# Patient Record
Sex: Female | Born: 1955 | Race: Black or African American | Hispanic: No | Marital: Married | Smoking: Former smoker
Health system: Southern US, Community
[De-identification: ages and names within clinical notes are randomized; demographics above are authoritative.]

## PROBLEM LIST (undated history)

## (undated) DIAGNOSIS — C801 Malignant (primary) neoplasm, unspecified: Secondary | ICD-10-CM

## (undated) DIAGNOSIS — I10 Essential (primary) hypertension: Secondary | ICD-10-CM

## (undated) DIAGNOSIS — C50919 Malignant neoplasm of unspecified site of unspecified female breast: Secondary | ICD-10-CM

---

## 2000-08-28 DIAGNOSIS — C801 Malignant (primary) neoplasm, unspecified: Secondary | ICD-10-CM

## 2000-08-28 DIAGNOSIS — C50919 Malignant neoplasm of unspecified site of unspecified female breast: Secondary | ICD-10-CM

## 2000-08-28 HISTORY — DX: Malignant (primary) neoplasm, unspecified: C80.1

## 2000-08-28 HISTORY — PX: BREAST BIOPSY: SHX20

## 2000-08-28 HISTORY — DX: Malignant neoplasm of unspecified site of unspecified female breast: C50.919

## 2004-10-06 ENCOUNTER — Ambulatory Visit: Payer: Self-pay | Admitting: Oncology

## 2004-10-26 ENCOUNTER — Ambulatory Visit: Payer: Self-pay | Admitting: Oncology

## 2005-04-03 ENCOUNTER — Ambulatory Visit: Payer: Self-pay | Admitting: General Surgery

## 2005-04-06 ENCOUNTER — Ambulatory Visit: Payer: Self-pay | Admitting: Oncology

## 2005-04-28 ENCOUNTER — Ambulatory Visit: Payer: Self-pay | Admitting: Oncology

## 2005-10-17 ENCOUNTER — Ambulatory Visit: Payer: Self-pay | Admitting: Oncology

## 2005-10-26 ENCOUNTER — Ambulatory Visit: Payer: Self-pay | Admitting: Oncology

## 2006-04-02 ENCOUNTER — Ambulatory Visit: Payer: Self-pay | Admitting: Oncology

## 2006-04-04 ENCOUNTER — Ambulatory Visit: Payer: Self-pay | Admitting: General Surgery

## 2006-04-28 ENCOUNTER — Ambulatory Visit: Payer: Self-pay | Admitting: Oncology

## 2006-08-30 ENCOUNTER — Ambulatory Visit: Payer: Self-pay | Admitting: Oncology

## 2006-09-28 ENCOUNTER — Ambulatory Visit: Payer: Self-pay | Admitting: Oncology

## 2007-01-27 ENCOUNTER — Ambulatory Visit: Payer: Self-pay | Admitting: Oncology

## 2007-02-25 ENCOUNTER — Ambulatory Visit: Payer: Self-pay | Admitting: Oncology

## 2007-02-26 ENCOUNTER — Ambulatory Visit: Payer: Self-pay | Admitting: Oncology

## 2007-04-17 ENCOUNTER — Ambulatory Visit: Payer: Self-pay | Admitting: General Surgery

## 2007-07-29 ENCOUNTER — Ambulatory Visit: Payer: Self-pay | Admitting: Oncology

## 2007-08-26 ENCOUNTER — Ambulatory Visit: Payer: Self-pay | Admitting: Oncology

## 2007-08-29 ENCOUNTER — Ambulatory Visit: Payer: Self-pay | Admitting: Oncology

## 2008-01-27 ENCOUNTER — Ambulatory Visit: Payer: Self-pay | Admitting: Oncology

## 2008-03-02 ENCOUNTER — Ambulatory Visit: Payer: Self-pay | Admitting: Oncology

## 2008-03-28 ENCOUNTER — Ambulatory Visit: Payer: Self-pay | Admitting: Oncology

## 2008-04-28 ENCOUNTER — Ambulatory Visit: Payer: Self-pay | Admitting: Oncology

## 2008-07-10 ENCOUNTER — Ambulatory Visit: Payer: Self-pay | Admitting: Gastroenterology

## 2008-09-28 ENCOUNTER — Ambulatory Visit: Payer: Self-pay | Admitting: Oncology

## 2008-10-07 ENCOUNTER — Ambulatory Visit: Payer: Self-pay | Admitting: Oncology

## 2008-10-26 ENCOUNTER — Ambulatory Visit: Payer: Self-pay | Admitting: Oncology

## 2009-03-28 ENCOUNTER — Ambulatory Visit: Payer: Self-pay | Admitting: Oncology

## 2009-04-22 ENCOUNTER — Ambulatory Visit: Payer: Self-pay | Admitting: Oncology

## 2009-04-26 ENCOUNTER — Ambulatory Visit: Payer: Self-pay | Admitting: Oncology

## 2009-04-28 ENCOUNTER — Ambulatory Visit: Payer: Self-pay | Admitting: Oncology

## 2009-09-28 ENCOUNTER — Ambulatory Visit: Payer: Self-pay | Admitting: Oncology

## 2009-10-07 ENCOUNTER — Ambulatory Visit: Payer: Self-pay | Admitting: Oncology

## 2009-10-26 ENCOUNTER — Ambulatory Visit: Payer: Self-pay | Admitting: Oncology

## 2010-04-25 ENCOUNTER — Ambulatory Visit: Payer: Self-pay | Admitting: Oncology

## 2010-04-28 ENCOUNTER — Ambulatory Visit: Payer: Self-pay | Admitting: Oncology

## 2010-04-29 LAB — CANCER ANTIGEN 27.29: CA 27.29: 14.5 U/mL (ref 0.0–38.6)

## 2010-05-28 ENCOUNTER — Ambulatory Visit: Payer: Self-pay | Admitting: Oncology

## 2011-03-27 ENCOUNTER — Ambulatory Visit: Payer: Self-pay | Admitting: Oncology

## 2011-03-29 ENCOUNTER — Ambulatory Visit: Payer: Self-pay | Admitting: Oncology

## 2011-06-15 ENCOUNTER — Ambulatory Visit: Payer: Self-pay | Admitting: Oncology

## 2011-12-06 ENCOUNTER — Ambulatory Visit: Payer: Self-pay | Admitting: Oncology

## 2011-12-06 LAB — COMPREHENSIVE METABOLIC PANEL
Anion Gap: 10 (ref 7–16)
BUN: 10 mg/dL (ref 7–18)
Calcium, Total: 9.3 mg/dL (ref 8.5–10.1)
Chloride: 103 mmol/L (ref 98–107)
Co2: 27 mmol/L (ref 21–32)
Glucose: 76 mg/dL (ref 65–99)
Potassium: 3.5 mmol/L (ref 3.5–5.1)
SGPT (ALT): 26 U/L
Total Protein: 7.7 g/dL (ref 6.4–8.2)

## 2011-12-06 LAB — CBC CANCER CENTER
Basophil #: 0 x10 3/mm (ref 0.0–0.1)
Eosinophil #: 0.1 x10 3/mm (ref 0.0–0.7)
Eosinophil %: 1.5 %
HCT: 37.4 % (ref 35.0–47.0)
HGB: 12.1 g/dL (ref 12.0–16.0)
Lymphocyte #: 2.6 x10 3/mm (ref 1.0–3.6)
Lymphocyte %: 35.5 %
MCH: 27.6 pg (ref 26.0–34.0)
MCV: 85 fL (ref 80–100)
Monocyte %: 9.6 %
Neutrophil #: 3.9 x10 3/mm (ref 1.4–6.5)
RBC: 4.37 10*6/uL (ref 3.80–5.20)
RDW: 13.3 % (ref 11.5–14.5)

## 2011-12-27 ENCOUNTER — Ambulatory Visit: Payer: Self-pay | Admitting: Oncology

## 2012-06-17 ENCOUNTER — Ambulatory Visit: Payer: Self-pay | Admitting: Oncology

## 2012-06-19 ENCOUNTER — Ambulatory Visit: Payer: Self-pay | Admitting: Oncology

## 2012-06-19 LAB — COMPREHENSIVE METABOLIC PANEL
Alkaline Phosphatase: 98 U/L (ref 50–136)
Anion Gap: 10 (ref 7–16)
Bilirubin,Total: 0.2 mg/dL (ref 0.2–1.0)
Chloride: 103 mmol/L (ref 98–107)
Co2: 27 mmol/L (ref 21–32)
Creatinine: 0.84 mg/dL (ref 0.60–1.30)
EGFR (African American): >60
EGFR (Non-African Amer.): >60
Glucose: 81 mg/dL (ref 65–99)
Osmolality: 278 (ref 275–301)
SGOT(AST): 25 U/L (ref 15–37)
SGPT (ALT): 28 U/L (ref 12–78)
Sodium: 140 mmol/L (ref 136–145)
Total Protein: 7.9 g/dL (ref 6.4–8.2)

## 2012-06-19 LAB — CBC CANCER CENTER
Basophil #: 0 x10 3/mm (ref 0.0–0.1)
Basophil %: 0.6 %
Eosinophil #: 0.2 x10 3/mm (ref 0.0–0.7)
Eosinophil %: 2.5 %
HCT: 38 % (ref 35.0–47.0)
HGB: 12 g/dL (ref 12.0–16.0)
Lymphocyte #: 2.8 x10 3/mm (ref 1.0–3.6)
MCH: 26.8 pg (ref 26.0–34.0)
MCHC: 31.6 g/dL — ABNORMAL LOW (ref 32.0–36.0)
MCV: 85 fL (ref 80–100)
Monocyte #: 0.8 x10 3/mm (ref 0.2–0.9)
Neutrophil #: 3 x10 3/mm (ref 1.4–6.5)
Neutrophil %: 44.5 %
Platelet: 301 x10 3/mm (ref 150–440)
RBC: 4.48 10*6/uL (ref 3.80–5.20)
RDW: 13.7 % (ref 11.5–14.5)

## 2012-06-28 ENCOUNTER — Ambulatory Visit: Payer: Self-pay | Admitting: Oncology

## 2013-07-09 ENCOUNTER — Ambulatory Visit: Payer: Self-pay | Admitting: Oncology

## 2013-07-14 ENCOUNTER — Ambulatory Visit: Payer: Self-pay | Admitting: Oncology

## 2013-07-14 LAB — COMPREHENSIVE METABOLIC PANEL
Albumin: 3.9 g/dL (ref 3.4–5.0)
Alkaline Phosphatase: 83 U/L (ref 50–136)
Bilirubin,Total: 0.2 mg/dL (ref 0.2–1.0)
Chloride: 102 mmol/L (ref 98–107)
Co2: 28 mmol/L (ref 21–32)
Creatinine: 1 mg/dL (ref 0.60–1.30)
EGFR (African American): >60
EGFR (Non-African Amer.): >60
Potassium: 3.6 mmol/L (ref 3.5–5.1)
SGOT(AST): 22 U/L (ref 15–37)
Sodium: 140 mmol/L (ref 136–145)
Total Protein: 8 g/dL (ref 6.4–8.2)

## 2013-07-14 LAB — CBC CANCER CENTER
Basophil %: 0.5 %
Eosinophil #: 0.2 x10 3/mm (ref 0.0–0.7)
Eosinophil %: 1.5 %
HCT: 39.8 % (ref 35.0–47.0)
HGB: 12.7 g/dL (ref 12.0–16.0)
MCHC: 31.8 g/dL — ABNORMAL LOW (ref 32.0–36.0)
Monocyte #: 0.7 x10 3/mm (ref 0.2–0.9)
Monocyte %: 7.2 %
Neutrophil #: 6 x10 3/mm (ref 1.4–6.5)
Neutrophil %: 58.7 %
Platelet: 348 x10 3/mm (ref 150–440)
RDW: 13.7 % (ref 11.5–14.5)
WBC: 10.2 x10 3/mm (ref 3.6–11.0)

## 2013-07-14 LAB — URINALYSIS, COMPLETE
Bacteria: NONE SEEN
Blood: NEGATIVE
Glucose,UR: NEGATIVE mg/dL (ref 0–75)
Ketone: NEGATIVE
Nitrite: NEGATIVE
Protein: NEGATIVE
Specific Gravity: 1.02 (ref 1.003–1.030)
Squamous Epithelial: 3
WBC UR: 3 /HPF (ref 0–5)

## 2013-07-28 ENCOUNTER — Ambulatory Visit: Payer: Self-pay | Admitting: Oncology

## 2014-09-22 ENCOUNTER — Ambulatory Visit: Payer: Self-pay | Admitting: Oncology

## 2014-10-13 ENCOUNTER — Ambulatory Visit: Payer: Self-pay | Admitting: Oncology

## 2016-03-28 ENCOUNTER — Other Ambulatory Visit: Payer: Self-pay | Admitting: Family Medicine

## 2016-03-28 DIAGNOSIS — Z1231 Encounter for screening mammogram for malignant neoplasm of breast: Secondary | ICD-10-CM

## 2016-04-14 ENCOUNTER — Ambulatory Visit: Payer: Self-pay

## 2016-04-24 ENCOUNTER — Ambulatory Visit
Admission: RE | Admit: 2016-04-24 | Discharge: 2016-04-24 | Disposition: A | Discharge status: Home / Self Care | Payer: BLUE CROSS/BLUE SHIELD | Source: Ambulatory Visit | Attending: Family Medicine | Admitting: Family Medicine

## 2016-04-24 ENCOUNTER — Other Ambulatory Visit: Payer: Self-pay | Admitting: Family Medicine

## 2016-04-24 ENCOUNTER — Ambulatory Visit: Payer: Self-pay

## 2016-04-24 DIAGNOSIS — Z1231 Encounter for screening mammogram for malignant neoplasm of breast: Secondary | ICD-10-CM

## 2016-04-24 HISTORY — DX: Malignant neoplasm of unspecified site of unspecified female breast: C50.919

## 2016-04-24 HISTORY — DX: Malignant (primary) neoplasm, unspecified: C80.1

## 2017-06-08 ENCOUNTER — Other Ambulatory Visit: Payer: Self-pay | Admitting: Family Medicine

## 2017-07-18 ENCOUNTER — Other Ambulatory Visit: Payer: Self-pay

## 2017-07-18 ENCOUNTER — Ambulatory Visit
Admission: RE | Admit: 2017-07-18 | Discharge: 2017-07-18 | Disposition: A | Discharge status: Home / Self Care | Payer: Self-pay | Source: Ambulatory Visit | Attending: Oncology | Admitting: Oncology

## 2017-07-18 ENCOUNTER — Ambulatory Visit: Payer: Self-pay | Attending: Oncology | Admitting: *Deleted

## 2017-07-18 ENCOUNTER — Encounter: Payer: Self-pay | Admitting: *Deleted

## 2017-07-18 VITALS — BP 154/89 | HR 81 | Temp 99.0°F | Ht 65.0 in | Wt 221.0 lb

## 2017-07-18 DIAGNOSIS — Z0000 Encounter for general adult medical examination without abnormal findings: Secondary | ICD-10-CM

## 2017-07-18 NOTE — Patient Instructions (Signed)
Gave patient hand-out, Women Staying Healthy, Active and Well from [facility], with education on breast health, pap smears, heart and colon health. 

## 2017-07-18 NOTE — Progress Notes (Signed)
Subjective:     Patient ID: Rhonda Nguyen, female   DOB: Sep 09, 1955, 61 y.o.   MRN: [id number]  HPI   Review of Systems     Objective:   Physical Exam  Pulmonary/Chest: Right breast exhibits no inverted nipple, no mass, no nipple discharge, no skin change and no tenderness. Left breast exhibits no inverted nipple, no mass, no nipple discharge, no skin change and no tenderness. Breasts are asymmetrical.         Assessment:     61 year old Black female presents to [facility] for clinical breast exam and mammogram only.  She has a history of left breast cancer diagnosed in 2003.  She was treated with adriamycin, cytoxan, radiation therapy and antihormonal therapy for about 8 years.  On clinical breast exam the left breast is very misshapen.  There is induration noted at the scar site.  The breast is also less than 1/2 the size of the right.  There is no dominate mass, skin changes, nipple discharge, or lymphadenopathy.  Taught self breast awareness.  States she has a family history of breast cancer in 2 sisters.  One diagnosed in her 27's and the other one in her 1's.  She is one of 11 children.  Last pap on 06/13/17 was HPV negative / ASCUS.  Explained results and need for another pap smear next year.  Patient has been screened for eligibility.  She does not have any insurance, Medicare or Medicaid.  She also meets financial eligibility.  Hand-out given on the Affordable Care Act.    Plan:     Screening mammogram ordered.  Will follow-up per [facility] protocol.

## 2017-08-17 ENCOUNTER — Emergency Department: Payer: Self-pay

## 2017-08-17 ENCOUNTER — Emergency Department
Admission: EM | Admit: 2017-08-17 | Discharge: 2017-08-17 | Disposition: A | Discharge status: Home / Self Care | Payer: Self-pay | Attending: Emergency Medicine | Admitting: Emergency Medicine

## 2017-08-17 ENCOUNTER — Other Ambulatory Visit: Payer: Self-pay

## 2017-08-17 DIAGNOSIS — Y92410 Unspecified street and highway as the place of occurrence of the external cause: Secondary | ICD-10-CM | POA: Insufficient documentation

## 2017-08-17 DIAGNOSIS — S39012A Strain of muscle, fascia and tendon of lower back, initial encounter: Secondary | ICD-10-CM | POA: Insufficient documentation

## 2017-08-17 DIAGNOSIS — S161XXA Strain of muscle, fascia and tendon at neck level, initial encounter: Secondary | ICD-10-CM | POA: Insufficient documentation

## 2017-08-17 DIAGNOSIS — Z79899 Other long term (current) drug therapy: Secondary | ICD-10-CM | POA: Insufficient documentation

## 2017-08-17 DIAGNOSIS — Z853 Personal history of malignant neoplasm of breast: Secondary | ICD-10-CM | POA: Insufficient documentation

## 2017-08-17 DIAGNOSIS — Y939 Activity, unspecified: Secondary | ICD-10-CM | POA: Insufficient documentation

## 2017-08-17 DIAGNOSIS — Y998 Other external cause status: Secondary | ICD-10-CM | POA: Insufficient documentation

## 2017-08-17 MED ORDER — BACLOFEN 10 MG PO TABS: 10.0000 mg | ORAL_TABLET | Freq: Every day | ORAL | 1 refills | Status: AC

## 2017-08-17 MED ORDER — MELOXICAM 15 MG PO TABS: 15.0000 mg | ORAL_TABLET | Freq: Every day | ORAL | 2 refills | Status: AC
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
## 2017-08-17 NOTE — Discharge Instructions (Signed)
Follow-up with your regular doctor if you are not better in 5-7 days, apply ice to any areas that hurt, after 3 days she can use wet heat followed by ice to any muscles that hurt, use the medication as prescribed, be aware that the muscle relaxer may make you drowsy, you can use the anti-inflammatory throughout the day, if you are worsening please return to the emergency department
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
## 2020-06-23 ENCOUNTER — Ambulatory Visit: Payer: Self-pay | Attending: Oncology | Admitting: *Deleted

## 2020-06-23 ENCOUNTER — Ambulatory Visit
Admission: RE | Admit: 2020-06-23 | Discharge: 2020-06-23 | Disposition: A | Discharge status: Home / Self Care | Payer: Self-pay | Source: Ambulatory Visit | Attending: Oncology | Admitting: Oncology

## 2020-06-23 ENCOUNTER — Other Ambulatory Visit: Payer: Self-pay

## 2020-06-23 ENCOUNTER — Encounter: Payer: Self-pay | Admitting: *Deleted

## 2020-06-23 VITALS — BP 151/78 | HR 79 | Temp 98.2°F | Ht 67.0 in | Wt 212.0 lb

## 2020-06-23 DIAGNOSIS — Z0000 Encounter for general adult medical examination without abnormal findings: Secondary | ICD-10-CM

## 2020-06-23 NOTE — Patient Instructions (Signed)
Gave patient hand-out, Women Staying Healthy, Active and Well from [facility], with education on breast health, pap smears, heart and colon health. 

## 2020-06-23 NOTE — Progress Notes (Signed)
Subjective:     Patient ID: Rhonda Nguyen, female   DOB: 05-29-1956, 64 y.o.   MRN: [id number]  HPI   [facility] Medical History Record - 06/23/20 8299      Breast History   Screening cycle New    Provider (CBE) Princella Ion    Initial Mammogram 06/23/20    Last Mammogram Annual    Last Mammogram Date 07/18/17    Provider (Mammogram)  Hartford Poli    Recent Breast Symptoms None      Breast Cancer History   Breast Cancer History Patient and mother/daughter/sister have had breast cancer    Comments/Details patient 2003; 2 sisters one in 20's one in 50's      Previous History of Breast Problems   Breast Surgery or Biopsy Left   breast cancer 2003 left breast   Breast Implants N/A    BSE Done Monthly      Gynecological/Obstetrical History   LMP --   17 years ago   Is there any chance that the client could be pregnant?  No    Age at menarche 72    Age at menopause 34    PAP smear history Annually    Date of last PAP  07/18/17    Provider (PAP) Dr Shade Flood    Age at first live birth 35    Breast fed children No    DES Exposure No    Cervical, Uterine or Ovarian cancer No    Family history of Cervial, Uterine or Ovarian cancer No    Hysterectomy No    Cervix removed No    Ovaries removed No    Laser/Cryosurgery No    Current method of birth control None    Current method of Estrogen/Hormone replacement None    Smoking history None             Review of Systems     Objective:   Physical Exam Chest:     Breasts: Breasts are asymmetrical.        Right: No swelling, bleeding, inverted nipple, mass, nipple discharge, skin change or tenderness.        Left: No swelling, bleeding, inverted nipple, mass, nipple discharge, skin change or tenderness.    Abdominal:     Palpations: There is no hepatomegaly or splenomegaly.  Genitourinary:    Exam position: Lithotomy position.     Labia:        Right: No rash, tenderness, lesion or injury.        Left: No rash, tenderness,  lesion or injury.      Urethra: No prolapse or urethral pain.     Vagina: No signs of injury and foreign body. No vaginal discharge, erythema, tenderness, bleeding, lesions or prolapsed vaginal walls.     Cervix: No cervical motion tenderness, discharge, friability, lesion, erythema, cervical bleeding or eversion.     Uterus: Not deviated, not enlarged, not fixed, not tender and no uterine prolapse.      Adnexa:        Right: No mass, tenderness or fullness.         Left: No mass, tenderness or fullness.       Rectum: No mass.     Lymphadenopathy:     Upper Body:     Right upper body: No supraclavicular or axillary adenopathy.     Left upper body: No supraclavicular or axillary adenopathy.        Assessment:     64 year old  female returns to [facility] for clinical breast exam, pap and mammogram.  Patient has a history of left breast cancer in 2003.  States she was treated with surgery, chemotherapy, radiation therapy and antihormonal therapy for 5 years.  Patient also has a family history of breast cancer in 2 sisters that were also diagnosed very young per patient.  She denies having genetic testing.  Discussed opportunity to talk with our genetic counselor about possible genetic testing.  She is to call me back if she is interested.  Clinical breast exam with smaller left breast with induration along the scar line.  There is no dominant mass, skin changes, nipple discharge or lymphadenopathy.  Taught self breast awareness.  Last pap on 06/13/17 was HPV negative ASCUS.  Specimen collected for pap smear without difficulty.  Patient has been screened for eligibility.  She does not have any insurance, Medicare or Medicaid.  She also meets financial eligibility.   Risk Assessment    Risk Scores      06/23/2020   Last edited by: Theodore Demark, RN   5-year risk: 6 %   Lifetime risk: 22 %             Plan:     Screening mammogram ordered.  Specimen for pap sent to the lab.  Will refer to  genetic counselor if patient is agreeable.  Will follow up per [facility] protocol.

## 2020-06-29 LAB — IGP, APTIMA HPV: HPV Aptima: NEGATIVE

## 2020-06-30 ENCOUNTER — Encounter: Payer: Self-pay | Admitting: *Deleted

## 2020-06-30 NOTE — Progress Notes (Signed)
Letter mailed to inform patient of her normal mammogram and her pap results.  Letter mailed with appointment for next pap and mammogram on 06/22/20 @ 9:00.  [facility] to [place].

## 2021-06-22 ENCOUNTER — Other Ambulatory Visit: Payer: Self-pay

## 2021-06-22 ENCOUNTER — Ambulatory Visit: Payer: Self-pay | Attending: Oncology

## 2021-06-22 ENCOUNTER — Ambulatory Visit
Admission: RE | Admit: 2021-06-22 | Discharge: 2021-06-22 | Disposition: A | Discharge status: Home / Self Care | Payer: Self-pay | Source: Ambulatory Visit | Attending: Oncology | Admitting: Oncology

## 2021-06-22 VITALS — BP 137/72 | HR 80 | Resp 18 | Ht 66.0 in | Wt 230.0 lb

## 2021-06-22 DIAGNOSIS — G5139 Clonic hemifacial spasm, unspecified: Secondary | ICD-10-CM | POA: Insufficient documentation

## 2021-06-22 DIAGNOSIS — Z0000 Encounter for general adult medical examination without abnormal findings: Secondary | ICD-10-CM

## 2021-06-22 NOTE — Progress Notes (Signed)
  Subjective:     Patient ID: Rhonda Nguyen, female   DOB: Nov 08, 1955, 65 y.o.   MRN: [id number]  HPI   Review of Systems     Objective:   Physical Exam Chest:  Breasts:    Right: No swelling, bleeding, inverted nipple, mass, nipple discharge, skin change or tenderness.     Left: No swelling, bleeding, inverted nipple, mass, nipple discharge, skin change or tenderness.  Genitourinary:    Labia:        Right: No rash, tenderness, lesion or injury.        Left: No rash, tenderness, lesion or injury.      Vagina: No signs of injury and foreign body. No vaginal discharge, erythema, tenderness, bleeding, lesions or prolapsed vaginal walls.     Cervix: No cervical motion tenderness, discharge, friability, lesion, erythema, cervical bleeding or eversion.     Uterus: Not deviated, not enlarged, not fixed, not tender and no uterine prolapse.      Adnexa:        Right: No mass, tenderness or fullness.         Left: No mass, tenderness or fullness.         Assessment:     65 year old patient returns for [facility] screening.  Per [facility] guidelines she requires a one year follow-up pap.  Patient screened, and meets [facility] eligibility.  Patient does not have insurance, Medicare or Medicaid.  Instructed patient on breast self awareness using teach back method.  Clinical breast exam unremarkable.  No mass or lump palpated. Pelvic exam normal.    Plan:     Sent for bilateral screening mammogram.  Specimen collected for pap.

## 2021-06-30 LAB — IGP, APTIMA HPV: HPV Aptima: NEGATIVE

## 2021-07-10 NOTE — Progress Notes (Signed)
Letter mailed to patient to notify of normal mammogram, and pap smear results. Patient to return in one year for annual screening.  Copy to [facility]. Next pap due in 3 years.

## 2022-04-04 ENCOUNTER — Other Ambulatory Visit: Payer: Self-pay | Admitting: Family Medicine

## 2022-04-04 DIAGNOSIS — Z1231 Encounter for screening mammogram for malignant neoplasm of breast: Secondary | ICD-10-CM

## 2022-04-10 ENCOUNTER — Other Ambulatory Visit: Payer: Self-pay | Admitting: Neurology

## 2022-04-10 DIAGNOSIS — G5131 Clonic hemifacial spasm, right: Secondary | ICD-10-CM

## 2022-05-31 IMAGING — MG MM DIGITAL SCREENING BILAT W/ TOMO AND CAD
8 series · 8 of 24 positions shown · non-contrast
Comparison: Previous exam(s).

CLINICAL DATA: Screening.

EXAM:
DIGITAL SCREENING BILATERAL MAMMOGRAM WITH TOMOSYNTHESIS AND CAD
TECHNIQUE: Bilateral screening digital craniocaudal and mediolateral oblique
mammograms were obtained. Bilateral screening digital breast
tomosynthesis was performed. The images were evaluated with
computer-aided detection.

[L MLO synth-2D]
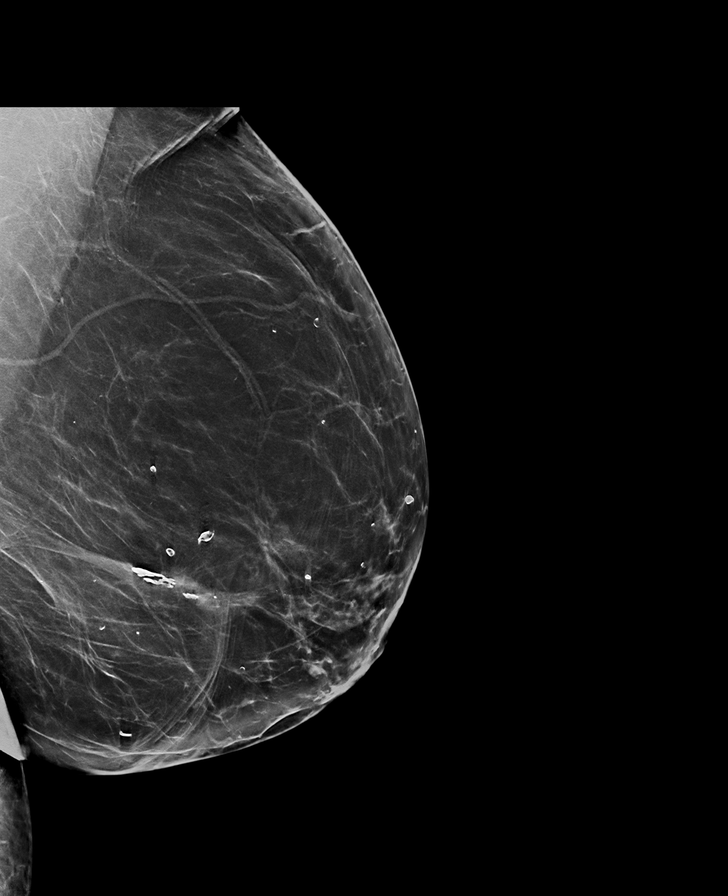

[R MLO synth-2D]
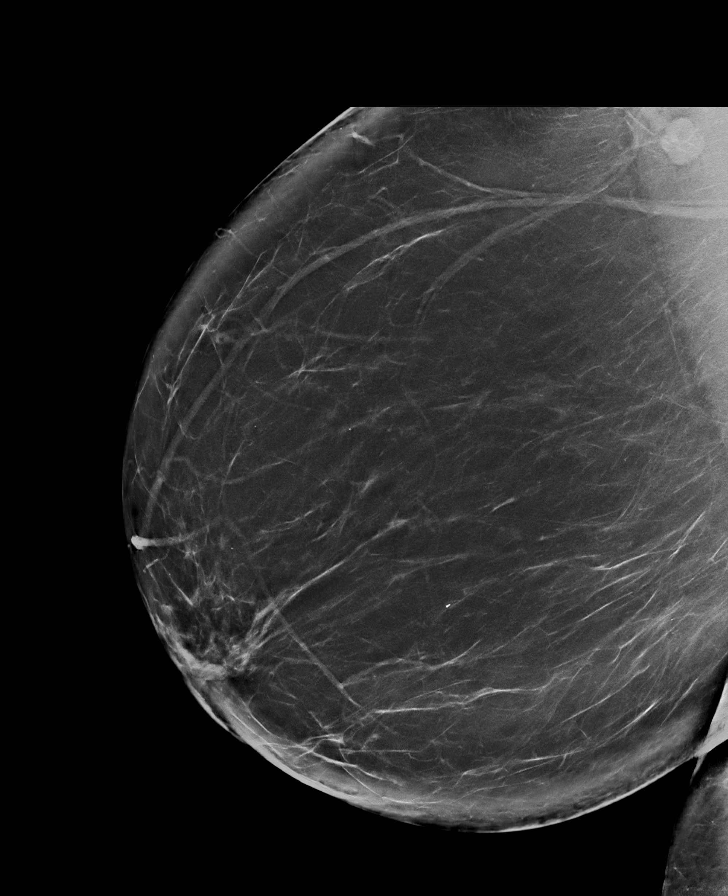

[R CC synth-2D]
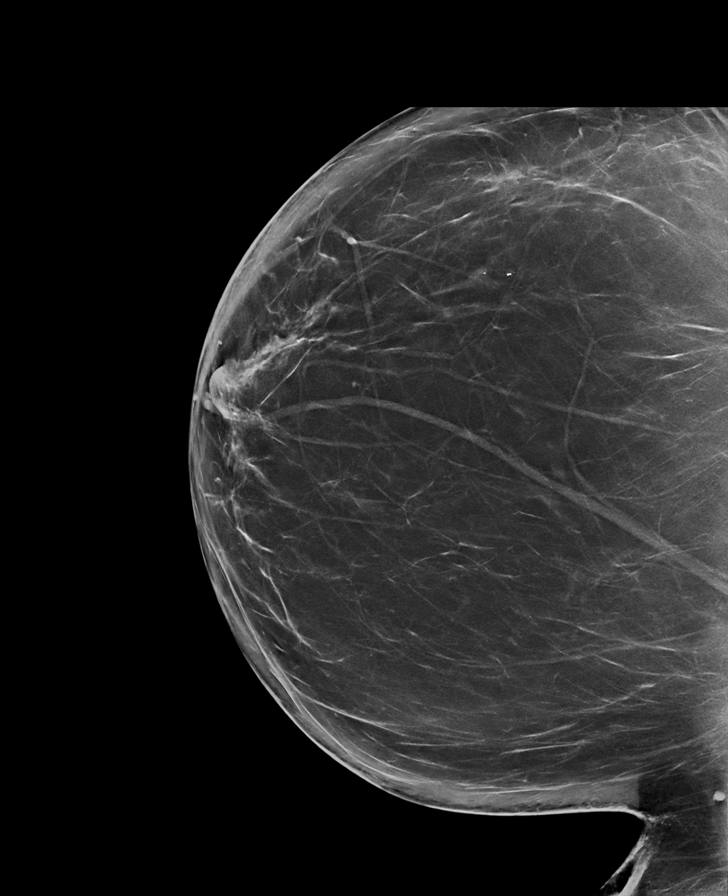

[L CC synth-2D]
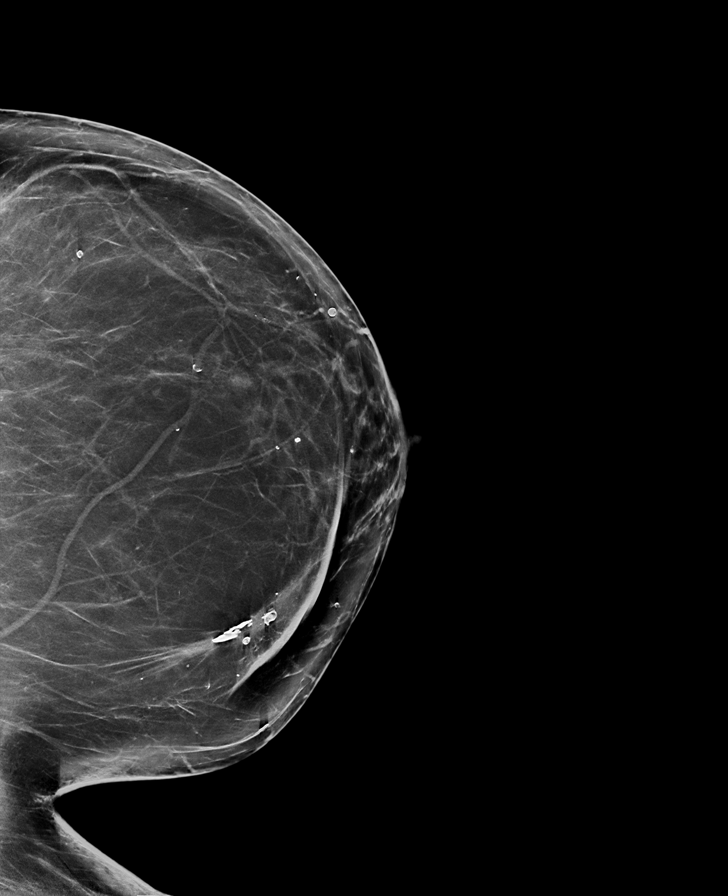

[L CC tomo · tomo slice 41/80.0]
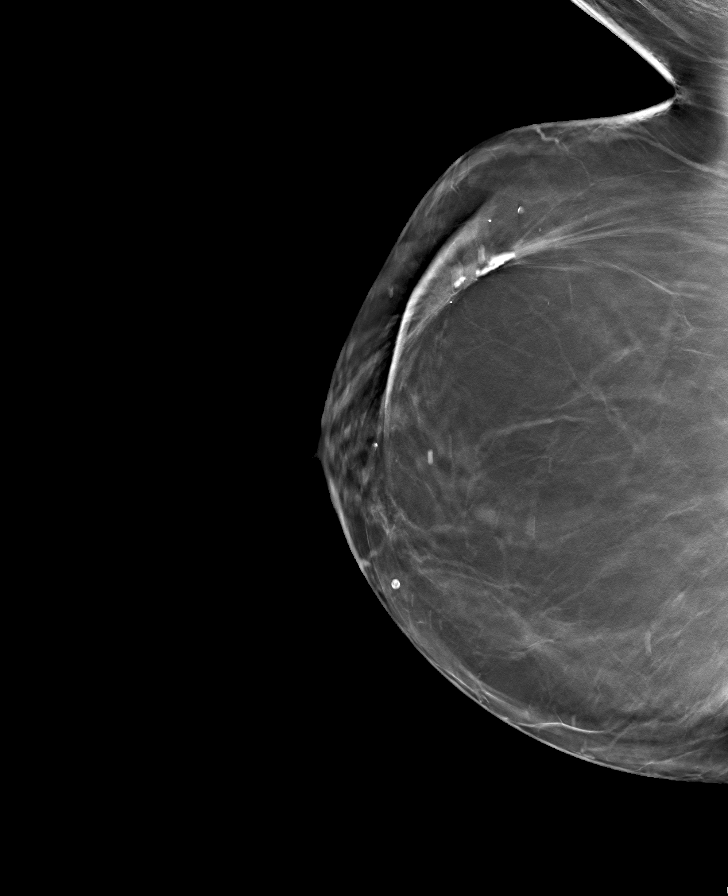

[R CC tomo · tomo slice 47/93.0]
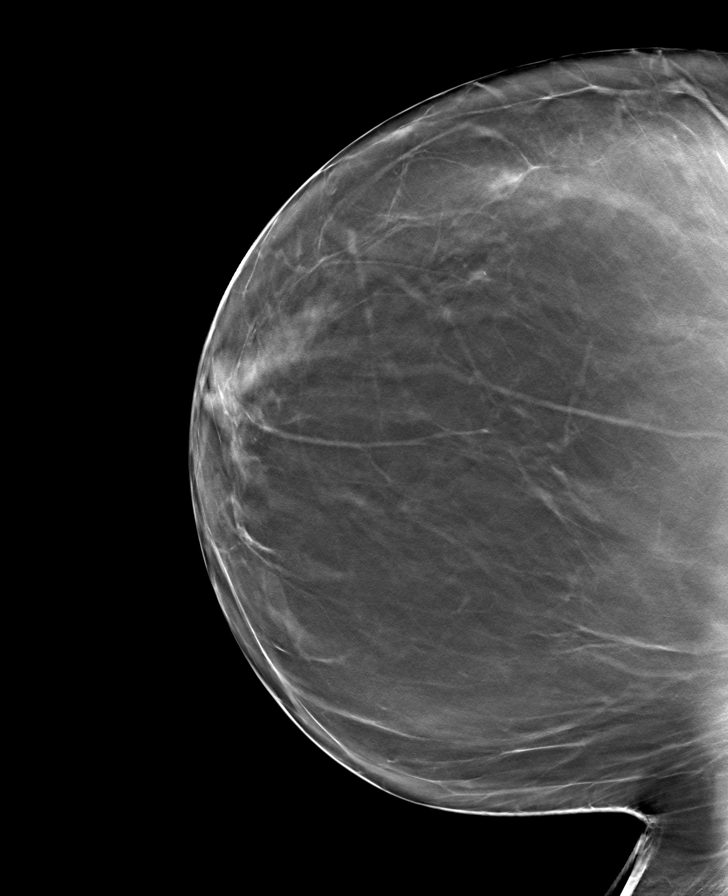

[L MLO tomo · tomo slice 43/86.0]
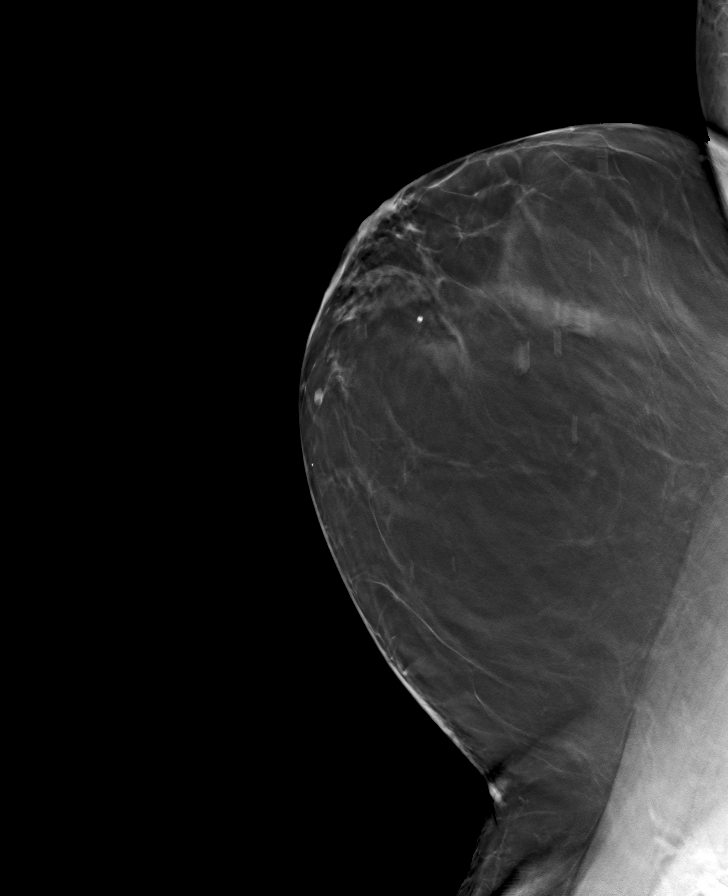

[R MLO tomo · tomo slice 50/99.0]
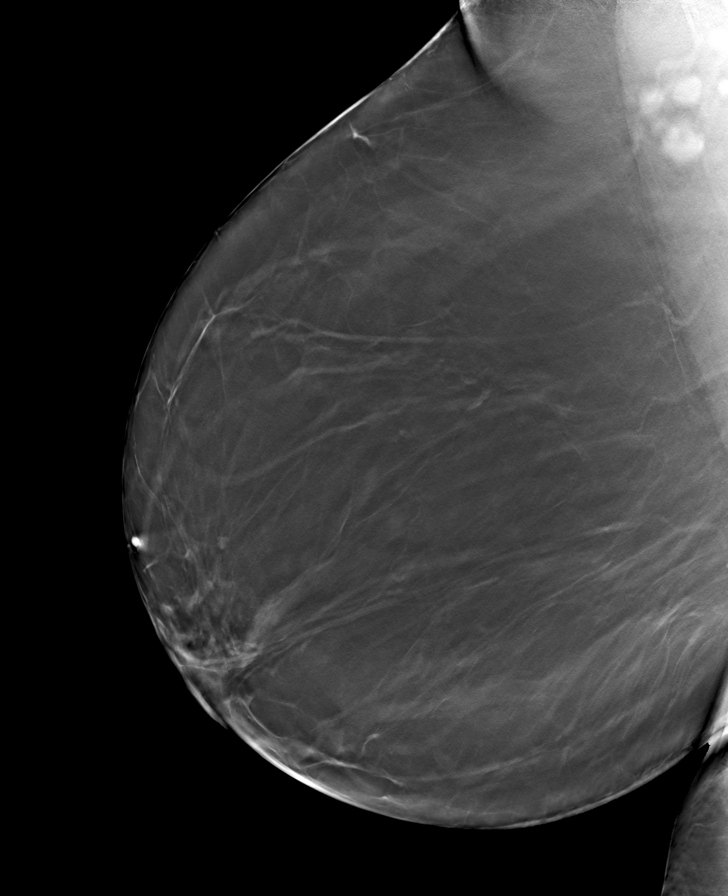

[8 of 24 positions shown; findings below may reference images not displayed]

ACR Breast Density Category b: There are scattered areas of
fibroglandular density.
FINDINGS: There are no findings suspicious for malignancy.
IMPRESSION: No mammographic evidence of malignancy. A result letter of this
screening mammogram will be mailed directly to the patient.

RECOMMENDATION:
Screening mammogram in one year. (Code:51-O-LD2)

BI-RADS CATEGORY  1: Negative.

## 2022-06-06 ENCOUNTER — Ambulatory Visit
Admission: RE | Admit: 2022-06-06 | Discharge: 2022-06-06 | Disposition: A | Discharge status: Home / Self Care | Payer: Medicare Other | Source: Ambulatory Visit | Attending: Neurology | Admitting: Neurology

## 2022-06-06 DIAGNOSIS — G5131 Clonic hemifacial spasm, right: Secondary | ICD-10-CM | POA: Insufficient documentation

## 2022-06-06 MED ORDER — GADOBUTROL 1 MMOL/ML IV SOLN
10.0000 mL | Freq: Once | INTRAVENOUS | Status: AC | PRN
  Administered 2022-06-06: 10 mL via INTRAVENOUS

## 2022-07-25 ENCOUNTER — Encounter: Payer: Self-pay | Admitting: Internal Medicine

## 2022-07-26 ENCOUNTER — Ambulatory Visit: Payer: Medicare Other | Admitting: Certified Registered"

## 2022-07-26 ENCOUNTER — Encounter
Admission: RE | Disposition: A | Discharge status: Home / Self Care | Payer: Self-pay | Source: Home / Self Care | Attending: Internal Medicine

## 2022-07-26 ENCOUNTER — Other Ambulatory Visit: Payer: Self-pay

## 2022-07-26 ENCOUNTER — Ambulatory Visit
Admission: RE | Admit: 2022-07-26 | Discharge: 2022-07-26 | Disposition: A | Discharge status: Home / Self Care | Payer: Medicare Other | Attending: Internal Medicine | Admitting: Internal Medicine

## 2022-07-26 ENCOUNTER — Encounter: Payer: Self-pay | Admitting: Internal Medicine

## 2022-07-26 DIAGNOSIS — Z87891 Personal history of nicotine dependence: Secondary | ICD-10-CM | POA: Insufficient documentation

## 2022-07-26 DIAGNOSIS — Z79899 Other long term (current) drug therapy: Secondary | ICD-10-CM | POA: Diagnosis not present

## 2022-07-26 DIAGNOSIS — I10 Essential (primary) hypertension: Secondary | ICD-10-CM | POA: Insufficient documentation

## 2022-07-26 DIAGNOSIS — Z853 Personal history of malignant neoplasm of breast: Secondary | ICD-10-CM | POA: Insufficient documentation

## 2022-07-26 DIAGNOSIS — Z923 Personal history of irradiation: Secondary | ICD-10-CM | POA: Insufficient documentation

## 2022-07-26 DIAGNOSIS — Z1211 Encounter for screening for malignant neoplasm of colon: Secondary | ICD-10-CM | POA: Insufficient documentation

## 2022-07-26 DIAGNOSIS — D125 Benign neoplasm of sigmoid colon: Secondary | ICD-10-CM | POA: Diagnosis not present

## 2022-07-26 HISTORY — PX: COLONOSCOPY WITH PROPOFOL: SHX5780

## 2022-07-26 HISTORY — DX: Essential (primary) hypertension: I10

## 2022-07-26 SURGERY — COLONOSCOPY WITH PROPOFOL
Anesthesia: General

## 2022-07-26 MED ORDER — GLYCOPYRROLATE 0.2 MG/ML IJ SOLN
INTRAMUSCULAR | Status: DC | PRN
  Administered 2022-07-26: .2 mg via INTRAVENOUS

## 2022-07-26 MED ORDER — PROPOFOL 500 MG/50ML IV EMUL
INTRAVENOUS | Status: DC | PRN
  Administered 2022-07-26: 155 ug/kg/min via INTRAVENOUS

## 2022-07-26 MED ORDER — PROPOFOL 10 MG/ML IV BOLUS
INTRAVENOUS | Status: DC | PRN
  Administered 2022-07-26: 100 mg via INTRAVENOUS

## 2022-07-26 MED ORDER — DEXMEDETOMIDINE HCL IN NACL 200 MCG/50ML IV SOLN
INTRAVENOUS | Status: DC | PRN
  Administered 2022-07-26: 8 ug via INTRAVENOUS

## 2022-07-26 MED ORDER — SODIUM CHLORIDE 0.9 % IV SOLN: INTRAVENOUS | Status: DC

## 2022-07-26 NOTE — Op Note (Signed)
[facility]astroenterology Patient Name: Rhonda Nguyen Procedure Date: 07/26/2022 12:50 PM MRN: [id number] Account #: [id number] Date of Birth: 05/08/1956 Admit Type: Outpatient Age: 66 Room: [facility] ENDO ROOM 2 Gender: Female Note Status: Finalized Instrument Name: Jasper Riling [id number] Procedure:             Colonoscopy Indications:           Screening for colorectal malignant neoplasm Providers:             Benay Pike. Alice Reichert MD, MD Referring MD:          Denton Lank MD, MD (Referring MD) Medicines:             Propofol per Anesthesia Complications:         No immediate complications. Procedure:             Pre-Anesthesia Assessment:                        - The risks and benefits of the procedure and the                         sedation options and risks were discussed with the                         patient. All questions were answered and informed                         consent was obtained.                        - Patient identification and proposed procedure were                         verified prior to the procedure by the nurse. The                         procedure was verified in the procedure room.                        - ASA Grade Assessment: III - A patient with severe                         systemic disease.                        - After reviewing the risks and benefits, the patient                         was deemed in satisfactory condition to undergo the                         procedure.                        After obtaining informed consent, the colonoscope was                         passed under direct vision. Throughout the procedure,  the patient's blood pressure, pulse, and oxygen                         saturations were monitored continuously. The                         Colonoscope was introduced through the anus and                         advanced to the the cecum, identified by appendiceal                          orifice and ileocecal valve. The colonoscopy was                         performed without difficulty. The patient tolerated                         the procedure well. The quality of the bowel                         preparation was good. The ileocecal valve, appendiceal                         orifice, and rectum were photographed. Findings:      The perianal and digital rectal examinations were normal. Pertinent       negatives include normal sphincter tone and no palpable rectal lesions.      A 5 mm polyp was found in the sigmoid colon. The polyp was sessile. The       polyp was removed with a jumbo cold forceps. Resection and retrieval       were complete.      The exam was otherwise without abnormality on direct and retroflexion       views. Impression:            - One 5 mm polyp in the sigmoid colon, removed with a                         jumbo cold forceps. Resected and retrieved.                        - The examination was otherwise normal on direct and                         retroflexion views. Recommendation:        - Patient has a contact number available for                         emergencies. The signs and symptoms of potential                         delayed complications were discussed with the patient.                         Return to normal activities tomorrow. Written                         discharge instructions were provided to  the patient.                        - Resume previous diet.                        - Continue present medications.                        - Repeat colonoscopy is recommended for surveillance.                         The colonoscopy date will be determined after                         pathology results from today's exam become available                         for review.                        - Return to GI office PRN.                        - The findings and recommendations were discussed with                         the  patient. Procedure Code(s):     --- Professional ---                        [phone], Colonoscopy, flexible; with biopsy, single or                         multiple Diagnosis Code(s):     --- Professional ---                        D12.5, Benign neoplasm of sigmoid colon                        Z12.11, Encounter for screening for malignant neoplasm                         of colon CPT copyright 2022 American Medical Association. All rights reserved. The codes documented in this report are preliminary and upon coder review may  be revised to meet current compliance requirements. Efrain Sella MD, MD 07/26/2022 1:18:58 PM This report has been signed electronically. Number of Addenda: 0 Note Initiated On: 07/26/2022 12:50 PM Scope Withdrawal Time: 0 hours 9 minutes 42 seconds  Total Procedure Duration: 0 hours 13 minutes 48 seconds  Estimated Blood Loss:  Estimated blood loss: none.      [facility]

## 2022-07-26 NOTE — Transfer of Care (Signed)
Immediate Anesthesia Transfer of Care Note  Patient: Rhonda Nguyen  Procedure(s) Performed: COLONOSCOPY WITH PROPOFOL  Patient Location: Endoscopy Unit  Anesthesia Type:General  Level of Consciousness: drowsy and patient cooperative  Airway & Oxygen Therapy: Patient Spontanous Breathing and Patient connected to face mask oxygen  Post-op Assessment: Report given to RN and Post -op Vital signs reviewed and stable  Post vital signs: Reviewed and stable  Last Vitals:  Vitals Value Taken Time  BP 134/81 07/26/22 1339  Temp 36 C 07/26/22 1319  Pulse 80 07/26/22 1346  Resp 19 07/26/22 1347  SpO2 100 % 07/26/22 1346  Vitals shown include unvalidated device data.  Last Pain:  Vitals:   07/26/22 1339  TempSrc:   PainSc: 0-No pain         Complications: No notable events documented.

## 2022-07-26 NOTE — Anesthesia Procedure Notes (Signed)
Procedure Name: General with mask airway Date/Time: 07/26/2022 1:11 PM  Performed by: Kelton Pillar, CRNAPre-anesthesia Checklist: Patient identified, Emergency Drugs available, Suction available and Patient being monitored Patient Re-evaluated:Patient Re-evaluated prior to induction Oxygen Delivery Method: Simple face mask Induction Type: IV induction Placement Confirmation: positive ETCO2, CO2 detector and breath sounds checked- equal and bilateral Dental Injury: Teeth and Oropharynx as per pre-operative assessment

## 2022-07-26 NOTE — H&P (Signed)
Outpatient short stay form Pre-procedure 07/26/2022 12:23 PM Rhonda Nguyen K. Alice Reichert, M.D.  Primary Physician: Denton Lank, M.D.  Reason for visit:  Colon cancer screening  History of present illness:  Patient presents for colonoscopy for colon cancer screening. The patient denies complaints of abdominal pain, significant change in bowel habits, or rectal bleeding.      Current Facility-Administered Medications:    0.9 %  sodium chloride infusion, , Intravenous, Continuous, Mora, Benay Pike, MD, Last Rate: 20 mL/hr at 07/26/22 1216, New Bag at 07/26/22 1216  Medications Prior to Admission  Medication Sig Dispense Refill Last Dose   latanoprost (XALATAN) 0.005 % ophthalmic solution    07/25/2022   lisinopril-hydrochlorothiazide (ZESTORETIC) 20-25 MG tablet Take 1 tablet by mouth daily.   07/26/2022 at 0730     Allergies  Allergen Reactions   Codeine    Hydrocodone-Acetaminophen Other (See Comments)     Past Medical History:  Diagnosis Date   Breast cancer ([place]) 2002   Left- chemo/rad   Cancer ([place]) 2002   Left breast   Hypertension     Review of systems:  Otherwise negative.    Physical Exam  Gen: Alert, oriented. Appears stated age.  HEENT: [place]/AT. PERRLA. Lungs: CTA, no wheezes. CV: RR nl S1, S2. Abd: soft, benign, no masses. BS+ Ext: No edema. Pulses 2+    Planned procedures: Proceed with colonoscopy. The patient understands the nature of the planned procedure, indications, risks, alternatives and potential complications including but not limited to bleeding, infection, perforation, damage to internal organs and possible oversedation/side effects from anesthesia. The patient agrees and gives consent to proceed.  Please refer to procedure notes for findings, recommendations and patient disposition/instructions.     Kambrie Eddleman K. Alice Reichert, M.D. Gastroenterology 07/26/2022  12:23 PM

## 2022-07-27 ENCOUNTER — Encounter: Payer: Self-pay | Admitting: Internal Medicine

## 2022-07-27 LAB — SURGICAL PATHOLOGY

## 2022-07-27 NOTE — Anesthesia Preprocedure Evaluation (Signed)
Anesthesia Evaluation  Patient identified by MRN, date of birth, ID band Patient awake    Reviewed: Allergy & Precautions, H&P , NPO status , Patient's Chart, lab work & pertinent test results, reviewed documented beta blocker date and time   Airway Mallampati: II   Neck ROM: full    Dental  (+) Poor Dentition   Pulmonary neg pulmonary ROS, former smoker   Pulmonary exam normal        Cardiovascular hypertension, On Medications Normal cardiovascular exam Rhythm:regular Rate:Normal     Neuro/Psych negative neurological ROS  negative psych ROS   GI/Hepatic negative GI ROS, Neg liver ROS,,,  Endo/Other  negative endocrine ROS    Renal/GU negative Renal ROS  negative genitourinary   Musculoskeletal   Abdominal   Peds  Hematology negative hematology ROS (+)   Anesthesia Other Findings Past Medical History: 2002: Breast cancer ([place])     Comment:  Left- chemo/rad 2002: Cancer ([place])     Comment:  Left breast No date: Hypertension Past Surgical History: 2002: BREAST BIOPSY; Left     Comment:  Positive BMI    Body Mass Index: 37.61 kg/m     Reproductive/Obstetrics negative OB ROS                             Anesthesia Physical Anesthesia Plan  ASA: 2  Anesthesia Plan: General   Post-op Pain Management:    Induction:   PONV Risk Score and Plan:   Airway Management Planned:   Additional Equipment:   Intra-op Plan:   Post-operative Plan:   Informed Consent: I have reviewed the patients History and Physical, chart, labs and discussed the procedure including the risks, benefits and alternatives for the proposed anesthesia with the patient or authorized representative who has indicated his/her understanding and acceptance.     Dental Advisory Given  Plan Discussed with: CRNA  Anesthesia Plan Comments:        Anesthesia Quick Evaluation

## 2022-07-27 NOTE — Anesthesia Postprocedure Evaluation (Signed)
Anesthesia Post Note  Patient: Rhonda Nguyen  Procedure(s) Performed: COLONOSCOPY WITH PROPOFOL  Patient location during evaluation: PACU Anesthesia Type: General Level of consciousness: awake and alert Pain management: pain level controlled Vital Signs Assessment: post-procedure vital signs reviewed and stable Respiratory status: spontaneous breathing, nonlabored ventilation, respiratory function stable and patient connected to nasal cannula oxygen Cardiovascular status: blood pressure returned to baseline and stable Postop Assessment: no apparent nausea or vomiting Anesthetic complications: no   No notable events documented.   Last Vitals:  Vitals:   07/26/22 1156 07/26/22 1319  BP: 137/63 (!) 94/56  Pulse: 85   Resp: 16 14  Temp: (!) 36 C (!) 36 C  SpO2: 100%     Last Pain:  Vitals:   07/27/22 0737  TempSrc:   PainSc: 0-No pain                 Molli Barrows

## 2022-08-11 ENCOUNTER — Ambulatory Visit
Admission: RE | Admit: 2022-08-11 | Discharge: 2022-08-11 | Disposition: A | Discharge status: Home / Self Care | Payer: Medicare Other | Source: Ambulatory Visit | Attending: Family Medicine | Admitting: Family Medicine

## 2022-08-11 DIAGNOSIS — Z1231 Encounter for screening mammogram for malignant neoplasm of breast: Secondary | ICD-10-CM | POA: Insufficient documentation

## 2022-08-24 ENCOUNTER — Encounter: Payer: Self-pay | Admitting: Podiatry

## 2022-08-24 ENCOUNTER — Ambulatory Visit: Payer: Medicare Other | Admitting: Podiatry

## 2022-08-24 VITALS — BP 132/66 | HR 71

## 2022-08-24 DIAGNOSIS — L600 Ingrowing nail: Secondary | ICD-10-CM | POA: Diagnosis not present

## 2022-08-24 NOTE — Progress Notes (Signed)
  Subjective:  Patient ID: Rhonda Nguyen, female    DOB: 30-Sep-1955,  MRN: [id number]  Chief Complaint  Patient presents with   Ingrown Toenail    Patient is here for bilateral great toe ingrown toe nail.    66 y.o. female presents with the above complaint.  Patient presents with bilateral lateral border ingrown pain for touch is progressive gotten worse worse with ambulation worse with pressure she would like to have removed she has not seen MRIs prior to seeing me pain scale 7 out of 10 dull achy in nature.  Hurts with ambulation worse with pressure   Review of Systems: Negative except as noted in the HPI. Denies N/V/F/Ch.  Past Medical History:  Diagnosis Date   Breast cancer ([place]) 2002   Left- chemo/rad   Cancer ([place]) 2002   Left breast   Hypertension     Current Outpatient Medications:    latanoprost (XALATAN) 0.005 % ophthalmic solution, , Disp: , Rfl:    lisinopril-hydrochlorothiazide (ZESTORETIC) 20-25 MG tablet, Take 1 tablet by mouth daily., Disp: , Rfl:   Social History   Tobacco Use  Smoking Status Former   Types: Cigarettes  Smokeless Tobacco Never  Tobacco Comments   Quit 20 years    Allergies  Allergen Reactions   Codeine    Hydrocodone-Acetaminophen Other (See Comments)   Objective:   Vitals:   08/24/22 0906  BP: 132/66  Pulse: 71   There is no height or weight on file to calculate BMI. Constitutional Well developed. Well nourished.  Vascular Dorsalis pedis pulses palpable bilaterally. Posterior tibial pulses palpable bilaterally. Capillary refill normal to all digits.  No cyanosis or clubbing noted. Pedal hair growth normal.  Neurologic Normal speech. Oriented to person, place, and time. Epicritic sensation to light touch grossly present bilaterally.  Dermatologic Painful ingrowing nail at lateral nail borders of the hallux nail bilaterally. No other open wounds. No skin lesions.  Orthopedic: Normal joint ROM without pain or crepitus  bilaterally. No visible deformities. No bony tenderness.   Radiographs: None Assessment:   1. Ingrown left big toenail   2. Ingrown nail of great toe of right foot    Plan:  Patient was evaluated and treated and all questions answered.  Ingrown Nail, bilaterally -Patient elects to proceed with minor surgery to remove ingrown toenail removal today. Consent reviewed and signed by patient. -Ingrown nail excised. See procedure note. -Educated on post-procedure care including soaking. Written instructions provided and reviewed. -Patient to follow up in 2 weeks for nail check.  Procedure: Excision of Ingrown Toenail Location: Bilateral 1st toe lateral nail borders. Anesthesia: Lidocaine 1% plain; 1.5 mL and Marcaine 0.5% plain; 1.5 mL, digital block. Skin Prep: Betadine. Dressing: Silvadene; telfa; dry, sterile, compression dressing. Technique: Following skin prep, the toe was exsanguinated and a tourniquet was secured at the base of the toe. The affected nail border was freed, split with a nail splitter, and excised. Chemical matrixectomy was then performed with phenol and irrigated out with alcohol. The tourniquet was then removed and sterile dressing applied. Disposition: Patient tolerated procedure well. Patient to return in 2 weeks for follow-up.   No follow-ups on file.

## 2022-09-26 ENCOUNTER — Ambulatory Visit: Payer: Medicare Other | Admitting: Podiatry

## 2022-09-26 DIAGNOSIS — L600 Ingrowing nail: Secondary | ICD-10-CM | POA: Diagnosis not present

## 2022-09-26 NOTE — Progress Notes (Signed)
  Subjective:  Patient ID: Rhonda Nguyen, female    DOB: 04/27/1956,  MRN: [id number]  Chief Complaint  Patient presents with   Ingrown Toenail    67 y.o. female presents with the above complaint.  Patient presents with now complaining bilateral medial border ingrown.  Patient states the lateral side is doing well.  She would like to have the medial side removed.  Pain scale 7 out of 10 hurts with ambulation hurts with pressure denies any other acute issues.   Review of Systems: Negative except as noted in the HPI. Denies N/V/F/Ch.  Past Medical History:  Diagnosis Date   Breast cancer ([place]) 2002   Left- chemo/rad   Cancer ([place]) 2002   Left breast   Hypertension     Current Outpatient Medications:    latanoprost (XALATAN) 0.005 % ophthalmic solution, , Disp: , Rfl:    lisinopril-hydrochlorothiazide (ZESTORETIC) 20-25 MG tablet, Take 1 tablet by mouth daily., Disp: , Rfl:   Social History   Tobacco Use  Smoking Status Former   Types: Cigarettes  Smokeless Tobacco Never  Tobacco Comments   Quit 20 years    Allergies  Allergen Reactions   Codeine    Hydrocodone-Acetaminophen Other (See Comments)   Objective:  There were no vitals filed for this visit. There is no height or weight on file to calculate BMI. Constitutional Well developed. Well nourished.  Vascular Dorsalis pedis pulses palpable bilaterally. Posterior tibial pulses palpable bilaterally. Capillary refill normal to all digits.  No cyanosis or clubbing noted. Pedal hair growth normal.  Neurologic Normal speech. Oriented to person, place, and time. Epicritic sensation to light touch grossly present bilaterally.  Dermatologic Painful ingrowing nail at medial nail borders of the hallux nail bilaterally. No other open wounds. No skin lesions.  Orthopedic: Normal joint ROM without pain or crepitus bilaterally. No visible deformities. No bony tenderness.   Radiographs: None Assessment:   1. Ingrown  left big toenail   2. Ingrown nail of great toe of right foot    Plan:  Patient was evaluated and treated and all questions answered.  Ingrown Nail, bilaterally -Patient elects to proceed with minor surgery to remove ingrown toenail removal today. Consent reviewed and signed by patient. -Ingrown nail excised. See procedure note. -Educated on post-procedure care including soaking. Written instructions provided and reviewed. -Patient to follow up in 2 weeks for nail check.  Procedure: Excision of Ingrown Toenail Location: Bilateral 1st toe medial nail borders. Anesthesia: Lidocaine 1% plain; 1.5 mL and Marcaine 0.5% plain; 1.5 mL, digital block. Skin Prep: Betadine. Dressing: Silvadene; telfa; dry, sterile, compression dressing. Technique: Following skin prep, the toe was exsanguinated and a tourniquet was secured at the base of the toe. The affected nail border was freed, split with a nail splitter, and excised. Chemical matrixectomy was then performed with phenol and irrigated out with alcohol. The tourniquet was then removed and sterile dressing applied. Disposition: Patient tolerated procedure well. Patient to return in 2 weeks for follow-up.   No follow-ups on file.

## 2022-10-13 ENCOUNTER — Ambulatory Visit: Payer: Medicare Other | Admitting: Podiatry

## 2022-10-17 ENCOUNTER — Ambulatory Visit: Payer: Medicare HMO | Admitting: Podiatry

## 2022-10-24 ENCOUNTER — Ambulatory Visit: Payer: Medicare HMO | Admitting: Podiatry

## 2022-11-15 ENCOUNTER — Ambulatory Visit: Payer: Medicare HMO | Admitting: Podiatry

## 2022-11-15 ENCOUNTER — Encounter: Payer: Self-pay | Admitting: Podiatry

## 2022-11-15 DIAGNOSIS — M79675 Pain in left toe(s): Secondary | ICD-10-CM

## 2022-11-15 DIAGNOSIS — M79674 Pain in right toe(s): Secondary | ICD-10-CM

## 2022-11-15 DIAGNOSIS — B351 Tinea unguium: Secondary | ICD-10-CM | POA: Diagnosis not present

## 2022-11-15 NOTE — Progress Notes (Signed)
  Subjective:  Patient ID: Rhonda Nguyen, female    DOB: 11-01-1955,  MRN: [id number]  Chief Complaint  Patient presents with   Nail Problem    Nail trim    67 y.o. female returns for the above complaint.  Patient presents with thickened elongated dystrophic toenails x 10 mild pain on palpation worse with ambulation worse with pressure she would like for me to be done she is not able to do it herself.  Denies any other acute complaints.  Objective:  There were no vitals filed for this visit. Podiatric Exam: Vascular: dorsalis pedis and posterior tibial pulses are palpable bilateral. Capillary return is immediate. Temperature gradient is WNL. Skin turgor WNL  Sensorium: Normal Semmes Weinstein monofilament test. Normal tactile sensation bilaterally. Nail Exam: Pt has thick disfigured discolored nails with subungual debris noted bilateral entire nail hallux through fifth toenails.  Pain on palpation to the nails. Ulcer Exam: There is no evidence of ulcer or pre-ulcerative changes or infection. Orthopedic Exam: Muscle tone and strength are WNL. No limitations in general ROM. No crepitus or effusions noted.  Skin: No Porokeratosis. No infection or ulcers    Assessment & Plan:   1. Pain due to onychomycosis of toenails of both feet     Patient was evaluated and treated and all questions answered.  Onychomycosis with pain  -Nails palliatively debrided as below. -Educated on self-care  Procedure: Nail Debridement Rationale: pain  Type of Debridement: manual, sharp debridement. Instrumentation: Nail nipper, rotary burr. Number of Nails: 10  Procedures and Treatment: Consent by patient was obtained for treatment procedures. The patient understood the discussion of treatment and procedures well. All questions were answered thoroughly reviewed. Debridement of mycotic and hypertrophic toenails, 1 through 5 bilateral and clearing of subungual debris. No ulceration, no infection noted.   Return Visit-Office Procedure: Patient instructed to return to the office for a follow up visit 3 months for continued evaluation and treatment.  Boneta Lucks, DPM    No follow-ups on file.

## 2023-09-07 NOTE — Telephone Encounter (Signed)
 Contacted patient to inform her that additional imaging is recommended by the breast imaging radiologist. I explained the reason for request and answered all patient questions to the best of my ability. The patient is aware that an order request has been sent to the provider. Once the orders are received, a member of the breast imaging team will call to schedule the appointment. Patient verbalized understanding. Patient stated that she will only be able to have the studies completed at the [facility] location.

## 2023-09-10 NOTE — Telephone Encounter (Signed)
 Contacted patient to schedule appointment for additional imaging as recommended by the breast imaging radiologist. I explained the reason for request and answered all patient questions to the best of my ability. Patient verbalized understanding. Appointment scheduled for 10/11/23 at the [facility] location.

## 2024-04-17 ENCOUNTER — Other Ambulatory Visit (INDEPENDENT_AMBULATORY_CARE_PROVIDER_SITE_OTHER): Payer: Self-pay | Admitting: Nurse Practitioner

## 2024-04-17 DIAGNOSIS — R9389 Abnormal findings on diagnostic imaging of other specified body structures: Secondary | ICD-10-CM

## 2024-04-17 DIAGNOSIS — M79605 Pain in left leg: Secondary | ICD-10-CM

## 2024-04-30 ENCOUNTER — Encounter (INDEPENDENT_AMBULATORY_CARE_PROVIDER_SITE_OTHER): Payer: Self-pay | Admitting: Nurse Practitioner

## 2024-04-30 ENCOUNTER — Ambulatory Visit (INDEPENDENT_AMBULATORY_CARE_PROVIDER_SITE_OTHER): Admitting: Nurse Practitioner

## 2024-04-30 ENCOUNTER — Ambulatory Visit (INDEPENDENT_AMBULATORY_CARE_PROVIDER_SITE_OTHER)

## 2024-04-30 VITALS — BP 136/81 | HR 70 | Ht 65.0 in | Wt 244.0 lb

## 2024-04-30 DIAGNOSIS — R6889 Other general symptoms and signs: Secondary | ICD-10-CM

## 2024-04-30 DIAGNOSIS — R9389 Abnormal findings on diagnostic imaging of other specified body structures: Secondary | ICD-10-CM | POA: Diagnosis not present

## 2024-04-30 DIAGNOSIS — M79605 Pain in left leg: Secondary | ICD-10-CM

## 2024-05-11 ENCOUNTER — Encounter (INDEPENDENT_AMBULATORY_CARE_PROVIDER_SITE_OTHER): Payer: Self-pay | Admitting: Nurse Practitioner

## 2024-05-11 NOTE — Progress Notes (Signed)
 Subjective:    Patient ID: Meade LITTIE Seeds, female    DOB: 04-Jul-1956, 68 y.o.   MRN: [id number] Chief Complaint  Patient presents with   New Patient (Initial Visit)    Patient is a 68 year old female that presents today after a recent home health visit showed that she had abnormal ABIs.  She denies any claudication symptoms.  She denies any rest pain or ulceration.  She does endorse having some pain in her left knee.  She has never had any previous interventions before her vascular status.  Today she has an ABI of 1.13 on the  right and 1.15 on the left.  Strong triphasic tibial waveforms bilaterally with good toe waveforms bilaterally and normal TBI's bilaterally.    Review of Systems  Musculoskeletal:  Positive for arthralgias.  All other systems reviewed and are negative.      Objective:   Physical Exam Vitals reviewed.  HENT:     Head: Normocephalic.  Cardiovascular:     Rate and Rhythm: Normal rate.     Pulses: Normal pulses.  Pulmonary:     Effort: Pulmonary effort is normal.  Skin:    General: Skin is warm and dry.  Neurological:     Mental Status: She is alert and oriented to person, place, and time.  Psychiatric:        Mood and Affect: Mood normal.        Thought Content: Thought content normal.        Judgment: Judgment normal.     BP 136/81   Pulse 70   Ht 5' 5 (1.651 m)   Wt 244 lb (110.7 kg)   BMI 40.60 kg/m   Past Medical History:  Diagnosis Date   Breast cancer ([facility]) 2002   Left- chemo/rad   Cancer ([facility]) 2002   Left breast   Hypertension     Social History   Socioeconomic History   Marital status: Married    Spouse name: Not on file   Number of children: Not on file   Years of education: Not on file   Highest education level: Not on file  Occupational History   Not on file  Tobacco Use   Smoking status: Former    Types: Cigarettes   Smokeless tobacco: Never   Tobacco comments:    Quit 20 years  Substance and Sexual Activity    Alcohol use: No   Drug use: No   Sexual activity: Not on file  Other Topics Concern   Not on file  Social History Narrative   Not on file   Social Drivers of Health   Financial Resource Strain: Not on file  Food Insecurity: Not on file  Transportation Needs: Not on file  Physical Activity: Not on file  Stress: Not on file  Social Connections: Not on file  Intimate Partner Violence: Not on file    Past Surgical History:  Procedure Laterality Date   BREAST BIOPSY Left 2002   Positive   COLONOSCOPY WITH PROPOFOL  N/A 07/26/2022   Procedure: COLONOSCOPY WITH PROPOFOL ;  Surgeon: Toledo, Ladell POUR, MD;  Location: [facility] ENDOSCOPY;  Service: Gastroenterology;  Laterality: N/A;    Family History  Problem Relation Age of Onset   Breast cancer Sister 37       62    Allergies  Allergen Reactions   Codeine    Hydrocodone-Acetaminophen Other (See Comments)   Sulfa Antibiotics Itching       Latest Ref Rng & Units 07/14/2013  3:21 PM 06/19/2012    3:35 PM 12/06/2011    3:40 PM  CBC  WBC 3.6 - 11.0 x10 3/mm 10.2  6.8  7.4   Hemoglobin 12.0 - 16.0 g/dL 87.2  87.9  87.8   Hematocrit 35.0 - 47.0 % 39.8  38.0  37.4   Platelets 150 - 440 x10 3/mm 348  301  284       CMP     Component Value Date/Time   NA 140 07/14/2013 1521   K 3.6 07/14/2013 1521   CL 102 07/14/2013 1521   CO2 28 07/14/2013 1521   GLUCOSE 113 (H) 07/14/2013 1521   BUN 12 07/14/2013 1521   CREATININE 1.00 07/14/2013 1521   CALCIUM 9.5 07/14/2013 1521   PROT 8.0 07/14/2013 1521   ALBUMIN 3.9 07/14/2013 1521   AST 22 07/14/2013 1521   ALT 26 07/14/2013 1521   ALKPHOS 83 07/14/2013 1521   BILITOT 0.2 07/14/2013 1521   GFRNONAA >60 07/14/2013 1521     VAS US  ABI WITH/WO TBI Result Date: 05/02/2024  LOWER EXTREMITY DOPPLER STUDY Patient Name:  ZAYNAH CHAWLA  Date of Exam:   04/30/2024 Medical Rec #: [id number]       Accession #:    [id number] Date of Birth: 03-Aug-1956       Patient Gender: F Patient Age:    46 years Exam Location:  [facility] Procedure:      VAS US  ABI WITH/WO TBI Referring Phys: --------------------------------------------------------------------------------  High Risk Factors: Hypertension. Other Factors: Left knee pain.  Performing Technologist: Jerel Croak RVT  Examination Guidelines: A complete evaluation includes at minimum, Doppler waveform signals and systolic blood pressure reading at the level of bilateral brachial, anterior tibial, and posterior tibial arteries, when vessel segments are accessible. Bilateral testing is considered an integral part of a complete examination. Photoelectric Plethysmograph (PPG) waveforms and toe systolic pressure readings are included as required and additional duplex testing as needed. Limited examinations for reoccurring indications may be performed as noted.  ABI Findings: +---------+------------------+-----+---------+--------+ Right    Rt Pressure (mmHg)IndexWaveform Comment  +---------+------------------+-----+---------+--------+ Brachial 150                                      +---------+------------------+-----+---------+--------+ PTA      166               1.11 triphasic         +---------+------------------+-----+---------+--------+ DP       170               1.13 triphasic         +---------+------------------+-----+---------+--------+ Great Toe160               1.07 Normal            +---------+------------------+-----+---------+--------+ +---------+------------------+-----+---------+-------+ Left     Lt Pressure (mmHg)IndexWaveform Comment +---------+------------------+-----+---------+-------+ Brachial 145                                     +---------+------------------+-----+---------+-------+ PTA      171               1.14 triphasic        +---------+------------------+-----+---------+-------+ DP       172               1.15  triphasic         +---------+------------------+-----+---------+-------+ Great Toe165               1.10 Normal           +---------+------------------+-----+---------+-------+  Summary: Right: Resting right ankle-brachial index is within normal range. The right toe-brachial index is normal.  Left: Resting left ankle-brachial index is within normal range. The left toe-brachial index is normal.  *See table(s) above for measurements and observations.  Electronically signed by Cordella Shawl MD on 05/02/2024 at 10:40:22 AM.    Final        Assessment & Plan:   1. Abnormal ankle brachial index (ABI) (Primary) Today noninvasive study showed no evidence of vascular abnormality.  Patient will follow-up with us  on an as-needed basis.   Current Outpatient Medications on File Prior to Visit  Medication Sig Dispense Refill   amLODipine (NORVASC) 2.5 MG tablet Take 2.5 mg by mouth daily.     aspirin EC 81 MG tablet Take by mouth.     latanoprost (XALATAN) 0.005 % ophthalmic solution      lisinopril-hydrochlorothiazide (ZESTORETIC) 20-25 MG tablet Take 1 tablet by mouth daily.     No current facility-administered medications on file prior to visit.    There are no Patient Instructions on file for this visit. No follow-ups on file.   Simonne Boulos E Jasmane Brockway, NP
